# Patient Record
Sex: Female | Born: 1970 | Race: White | Hispanic: No | Marital: Married
Health system: Midwestern US, Community
[De-identification: ages and names within clinical notes are randomized; demographics above are authoritative.]

## PROBLEM LIST (undated history)

## (undated) DIAGNOSIS — Z1231 Encounter for screening mammogram for malignant neoplasm of breast: Secondary | ICD-10-CM

---

## 2013-11-26 ENCOUNTER — Encounter

## 2013-12-17 NOTE — Progress Notes (Signed)
DATE OF VISIT: 12/17/2013      REFERRING PHYSICIAN: Leatha Gilding, MD  [address] [facility]  [place], [place] [address]    REASON FOR REFERRAL: varicose veins          Natalie Logan is a 43 y.o. female sent in consultation for varicose veins with symptoms L>R.  She has seen occurrence of the veins since her first pregnancy.  Symptoms are intermittent and include aching pain. She does not need to elevate her legs to relieve pain, she has not worn compression hose, she denies difficulties walking or standing.  She does not take pain relievers for pain. She has never had bleeding from her veins.      She has never had a formal evaluation of her varicose veins, never had vein injections or surgery.  Denies cellulitis, phlebitis, DVT or PE.  She is not interested in simple sclerotherapy at this time but rather interested in a complete work up.     Chief Complaint   Patient presents with   ??? New Patient     VV             MEDICAL HISTORY:   Past Medical History   Diagnosis Date   ??? H/O seasonal allergies    ??? Varicose vein    ??? Varicose veins of lower extremities with other complications 12/17/2013         SURGICAL HISTORY:   Past Surgical History   Procedure Laterality Date   ??? Hx tubal ligation     ??? Hx cholecystectomy         Review of Systems   A focused review of systems was negative except for that written in the HPI    ALLERGIES: No Known Allergies    CURRENT MEDICATIONS:  Current Outpatient Prescriptions   Medication Sig Dispense   ??? cetirizine (ZYRTEC) 10 mg tablet Take  by mouth daily.       No current facility-administered medications for this visit.         SOCIAL HISTORY:   History   Smoking status   ??? Never Smoker    Smokeless tobacco   ??? Never Used                                       History   Alcohol Use   ??? Yes         IMAGING: none      PHYSICAL EXAM  VITALS:   Filed Vitals:    12/17/13 1434   BP: 138/99   Pulse: 90   Height: 5\' 4"  (1.626 m)   Weight: 138 lb (62.596 kg)        GENERAL: Well developed, well nourished 43 y.o. in no acute distress  HEAD/NECK: normocephalic, atraumatic  LUNGS: excursions normal and symmetrical  HEART: Regular rate and rhythm  EXTREMITIES: upper without CCE.  Lower extremities: palpable distal pulses.  No clubbing, cyanosis or edema. Scattered reticular veins in the right medial and central thigh.  Scattered spider veins and reticular veins below the knees bilaterally with the left leg with raised varix below the knee near the calf. No hyperpigmentation or other chronic stigmata of chronic venous insufficiency.  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  She will return with a standing venous duplex of her legs and follow up with Dr Jean RosenthalJackson to review the results and provide recommendations.  I discussed with her that vein closure procedures are reserved for patient with life style limiting symptoms, I offered her sclerotherapy, but she was not interested and wished to persue a full vein work up of her veins.    Orders Placed This Encounter   ??? DUPLEX VENOUS REFLUX LEFT           Joline MaxcyKIM L Badr Piedra, NP

## 2015-09-14 ENCOUNTER — Encounter: Attending: Obstetrics & Gynecology

## 2015-11-04 ENCOUNTER — Encounter: Attending: Obstetrics & Gynecology

## 2016-01-12 ENCOUNTER — Ambulatory Visit: Admit: 2016-01-12 | Discharge: 2016-01-12 | Payer: BLUE CROSS/BLUE SHIELD | Attending: Obstetrics & Gynecology

## 2016-01-12 DIAGNOSIS — Z01419 Encounter for gynecological examination (general) (routine) without abnormal findings: Secondary | ICD-10-CM

## 2016-01-12 LAB — AMB POC URINALYSIS DIP STICK AUTO W/O MICRO
Bilirubin (UA POC): NEGATIVE
Glucose (UA POC): NEGATIVE
Ketones (UA POC): NEGATIVE
Leukocyte esterase (UA POC): NEGATIVE
Nitrites (UA POC): NEGATIVE
Specific gravity (UA POC): 1 — AB (ref 1.001–1.035)
Urobilinogen (UA POC): NORMAL (ref 0.2–1)
pH (UA POC): 5 (ref 4.6–8.0)

## 2016-01-12 LAB — AMB POC HEMOGLOBIN (HGB): Hemoglobin (POC): 10.7

## 2016-01-12 NOTE — Progress Notes (Signed)
Name: Natalie Logan  Chief Complaint   Patient presents with   ??? Well Woman     Pt states she had 2 periods in Dec. but not since then.     Age: 45 y.o.  LMP: Patient's last menstrual period was 01/12/2016.  OB History     Gravida Para Term Preterm AB TAB SAB Ectopic Multiple Living    3 2                Last Pap:  normal 2015  Last HPV:   n/a  High Risk: N/A risk factor n/a  Last Mammo:  n/a  Last Bone Density:  n/a  Health Maintenance Due   Topic Date Due   ??? Flu Vaccine  06/13/2015     History   Sexual Activity   ??? Sexual activity: Yes   ??? Partners: Male   ??? Birth control/ protection: Surgical       HPI  No complaints    Family History   Problem Relation Age of Onset   ??? Diabetes Mother    ??? Hypertension Father    ??? Other Maternal Grandmother      vv   ??? Diabetes Maternal Grandmother    ??? Heart Disease Maternal Grandmother    ??? Stroke Maternal Grandfather          There is no immunization history on file for this patient.    Past Medical History:   Diagnosis Date   ??? H/O seasonal allergies    ??? Varicose vein    ??? Varicose veins of lower extremities with other complications 12/17/2013       Past Surgical History:   Procedure Laterality Date   ??? HX CHOLECYSTECTOMY     ??? HX TUBAL LIGATION     ??? LAP,TUBAL CAUTERY         Social History     Social History   ??? Marital status: MARRIED     Spouse name: N/A   ??? Number of children: N/A   ??? Years of education: N/A     Occupational History   ??? Not on file.     Social History Main Topics   ??? Smoking status: Never Smoker   ??? Smokeless tobacco: Never Used   ??? Alcohol use Yes      Comment: occasional   ??? Drug use: No   ??? Sexual activity: Yes     Partners: Male     Birth control/ protection: Surgical     Other Topics Concern   ??? Not on file     Social History Narrative       Review of Systems   Constitutional: Negative.    HENT: Negative.    Eyes: Negative.    Respiratory: Negative.    Cardiovascular: Negative.    Gastrointestinal: Negative.     Genitourinary: Negative.  Negative for dysuria, frequency and urgency.   Musculoskeletal: Negative.    Skin: Negative.    Neurological: Negative.    Psychiatric/Behavioral: Negative.        No Known Allergies    Visit Vitals   ??? BP 138/76 (BP 1 Location: Left arm, BP Patient Position: Sitting)   ??? Ht  (1.626 m)   ??? Wt 139 lb (63 kg)   ??? LMP 01/12/2016   ??? BMI 23.86 kg/m2       Results for orders placed or performed in visit on 01/12/16   AMB POC URINALYSIS DIP STICK AUTO W/O MICRO  Result Value Ref Range    Color (UA POC) Red     Clarity (UA POC) Cloudy     Glucose (UA POC) Negative Negative    Bilirubin (UA POC) Negative Negative    Ketones (UA POC) Negative Negative    Specific gravity (UA POC) 1.000 (A) 1.001 - 1.035    Blood (UA POC) 4+ Negative    pH (UA POC) 5.0 4.6 - 8.0    Protein (UA POC) 3+ Negative mg/dL    Urobilinogen (UA POC) normal 0.2 - 1    Nitrites (UA POC) Negative Negative    Leukocyte esterase (UA POC) Negative Negative   AMB POC HEMOGLOBIN (HGB)   Result Value Ref Range    Hemoglobin (POC) 10.7        Physical Exam   Constitutional: She is well-developed, well-nourished, and in no distress.   HENT:   Head: Normocephalic and atraumatic.   Neck: No thyromegaly present.   Cardiovascular: Normal rate and regular rhythm.    Pulmonary/Chest: Effort normal and breath sounds normal. Right breast exhibits no mass, no nipple discharge and no skin change. Left breast exhibits no mass, no nipple discharge and no skin change.   Abdominal: Soft. She exhibits no mass.   Genitourinary: Vagina normal, uterus normal, cervix normal, right adnexa normal, left adnexa normal and vulva normal. No vaginal discharge found.   Neurological: She is alert.   Skin: No rash noted.   Psychiatric: Mood and affect normal.         Orders Placed This Encounter   ??? Urinalysis, Auto w/o Micro (81003)   ??? Hemoglobin (16109)   ??? IGP, Aptima HPV, Reflex 16/18, 45 ([id number])         Assessement and Plan     Dezerae was seen today for well woman.    Diagnoses and all orders for this visit:    Well woman exam    Screening for human papillomavirus  -     IGP, Aptima HPV, Reflex 16/18, 45 ([id number])    Encounter for special screening examination for genitourinary disorder  -     Urinalysis, Auto w/o Micro (81003)    Screening for iron deficiency anemia  -     Hemoglobin (47829)        Follow-up Disposition:  Return in about 1 year (around 01/11/2017) for well woman.      Deltha Bernales Efrain Sella, MD

## 2016-01-15 LAB — PAP IG, APTIMA HPV AND RFX 16/18,45 (199305)
HPV Aptima: NEGATIVE
LABCORP 019018: 0

## 2016-01-15 LAB — PAP IG, APTIMA HPV AND RFX 16/18,45 (507805)
.: 0
HPV APTIMA: NEGATIVE

## 2018-02-17 ENCOUNTER — Ambulatory Visit: Admit: 2018-02-17 | Discharge: 2018-02-17 | Payer: BLUE CROSS/BLUE SHIELD | Attending: Obstetrics & Gynecology

## 2018-02-17 DIAGNOSIS — Z01419 Encounter for gynecological examination (general) (routine) without abnormal findings: Secondary | ICD-10-CM

## 2018-02-17 LAB — AMB POC URINALYSIS DIP STICK AUTO W/O MICRO
Bilirubin (UA POC): NEGATIVE
Glucose (UA POC): NEGATIVE
Ketones (UA POC): NEGATIVE
Leukocyte esterase (UA POC): NEGATIVE
Nitrites (UA POC): NEGATIVE
Protein (UA POC): NEGATIVE
Specific gravity (UA POC): 1.005 (ref 1.001–1.035)
Urobilinogen (UA POC): NORMAL (ref 0.2–1)
pH (UA POC): 5 (ref 4.6–8.0)

## 2018-02-17 LAB — AMB POC HEMOGLOBIN (HGB): Hemoglobin (POC): 12.5

## 2018-02-17 NOTE — Progress Notes (Signed)
HPI:  Ms. Natalie Logan is a 47 y.o.   OB History     Gravida   3    Para   2    Term        Preterm        AB        Living           SAB        TAB        Ectopic        Molar        Multiple        Live Births                 who is here today for a well woman exam pt of Natalie Logan She complains of na. Would like yearly lab work drawn.   Not fasting- wants fasting bloodwork  Never had mammogram encouraged to get       Date Performed Result   PAP 03/17 wnl HPV neg   Mammogram na    Colonoscopy na    Dexa na          G3 P0000        GYN History           Patient's last menstrual period was 02/14/2018. Cycle Length 26 Lasting 3  negative dysmenorrhea; negative postcoital bleeding    Past Medical History:  Past Medical History:   Diagnosis Date   ??? H/O seasonal allergies    ??? Varicose vein    ??? Varicose veins of lower extremities with other complications 12/17/2013       Past Surgical History:  Past Surgical History:   Procedure Laterality Date   ??? HX CHOLECYSTECTOMY     ??? HX TUBAL LIGATION     ??? LAP,TUBAL CAUTERY         Allergies:   No Known Allergies    Medication History:  Current Outpatient Medications   Medication Sig Dispense Refill   ??? cetirizine (ZYRTEC) 10 mg tablet Take  by mouth daily.         Social History:  Social History     Socioeconomic History   ??? Marital status: MARRIED     Spouse name: Not on file   ??? Number of children: Not on file   ??? Years of education: Not on file   ??? Highest education level: Not on file   Occupational History   ??? Not on file   Social Needs   ??? Financial resource strain: Not on file   ??? Food insecurity:     Worry: Not on file     Inability: Not on file   ??? Transportation needs:     Medical: Not on file     Non-medical: Not on file   Tobacco Use   ??? Smoking status: Never Smoker   ??? Smokeless tobacco: Never Used   Substance and Sexual Activity   ??? Alcohol use: Yes     Comment: occasional   ??? Drug use: No   ??? Sexual activity: Yes     Partners: Male     Birth control/protection: Surgical      Comment: tubal   Lifestyle   ??? Physical activity:     Days per week: Not on file     Minutes per session: Not on file   ??? Stress: Not on file   Relationships   ??? Social connections:     Talks on phone: Not  on file     Gets together: Not on file     Attends religious service: Not on file     Active member of club or organization: Not on file     Attends meetings of clubs or organizations: Not on file     Relationship status: Not on file   ??? Intimate partner violence:     Fear of current or ex partner: Not on file     Emotionally abused: Not on file     Physically abused: Not on file     Forced sexual activity: Not on file   Other Topics Concern   ??? Not on file   Social History Narrative   ??? Not on file       Family History:  Family History   Problem Relation Age of Onset   ??? Diabetes Mother    ??? Hypertension Father    ??? Other Maternal Grandmother         vv   ??? Diabetes Maternal Grandmother    ??? Heart Disease Maternal Grandmother    ??? Stroke Maternal Grandfather        Review of Systems - General ROS: negative except for that discussed in HPI      ROS:  Feeling well. No dyspnea or chest pain on exertion.  No abdominal pain, change in bowel habits, black or bloody stools.  No urinary tract symptoms. No neurological complaints.    Objective:     Visit Vitals  BP 133/76   Wt 124 lb (56.2 kg)   LMP 02/14/2018   BMI 21.28 kg/m??     Urine:  Results for orders placed or performed in visit on 02/17/18   AMB POC URINALYSIS DIP STICK AUTO W/O MICRO     Status: None   Result Value Ref Range Status    Color (UA POC) Light Yellow  Final    Clarity (UA POC) Clear  Final    Glucose (UA POC) Negative Negative Final    Bilirubin (UA POC) Negative Negative Final    Ketones (UA POC) Negative Negative Final    Specific gravity (UA POC) 1.005 1.001 - 1.035 Final    Blood (UA POC) 1+ Negative Final    pH (UA POC) 5.0 4.6 - 8.0 Final    Protein (UA POC) Negative Negative Final    Urobilinogen (UA POC) normal 0.2 - 1 Final     Nitrites (UA POC) Negative Negative Final    Leukocyte esterase (UA POC) Negative Negative Final       The patient appears well, alert, oriented x 3, in no distress.  ENT normal.  Neck supple. No adenopathy or thyromegaly.   Lungs:  clear, good air entry, no wheezes, rhonchi or rales.   Heart:  S1 and S2 normal, no murmurs, regular rate and rhythm.  Abdomen:  soft without tenderness, guarding, mass or organomegaly.   Extremities show no edema, normal peripheral pulses.   Neurological is normal, no focal findings.    BREAST EXAM: breasts appear normal, no suspicious masses, no skin or nipple changes or axillary nodes    PELVIC EXAM: External genitalia is within normal limits, urethra, urethra meatus and bladder are midline well supported. Vagina is well rugated, Cervix comes into full view and is within normal limits. Uterus is 8, ante to right week size, no ovarian masses palpated  Hematuria today and last visit- states both times during menses  Assessment/Plan:       ICD-10-CM ICD-9-CM  1. Well woman exam Z01.419 V72.31    2. Encounter for special screening examination for genitourinary disorder Z13.89 V81.6 AMB POC URINALYSIS DIP STICK AUTO W/O MICRO   3. Screening for iron deficiency anemia Z13.0 V78.0 AMB POC HEMOGLOBIN (HGB)     Encounter Diagnoses   Name Primary?   ??? Well woman exam Yes   ??? Encounter for special screening examination for genitourinary disorder    ??? Screening for iron deficiency anemia      Orders Placed This Encounter   ??? Hemoglobin (40981)   ??? Urinalysis, Auto w/o Micro (81003)   .    mammogram  pap smear  return annually or prn    Natalie Du, DO

## 2018-02-17 NOTE — Addendum Note (Signed)
Addended by: Lolita PatellaBOWERS, Maxum Cassarino Y on: 02/17/2018 09:42 AM     Modules accepted: Orders

## 2018-02-17 NOTE — Addendum Note (Signed)
Addended by: Lolita PatellaBOWERS, Sharion Grieves Y on: 02/17/2018 09:43 AM     Modules accepted: Orders

## 2018-02-20 LAB — PAP IG, APTIMA HPV AND RFX 16/18,45 (199305)
HPV Aptima: NEGATIVE
LABCORP 019018: 0

## 2018-02-20 LAB — PAP IG, APTIMA HPV AND RFX 16/18,45 (507805)
.: 0
HPV APTIMA: NEGATIVE

## 2019-01-29 IMAGING — CR XR CHEST 1 VIEW
1 series · 1 of 1 positions shown · non-contrast
Comparison: none

Portable chest
INDICATION: Cough

[AP]
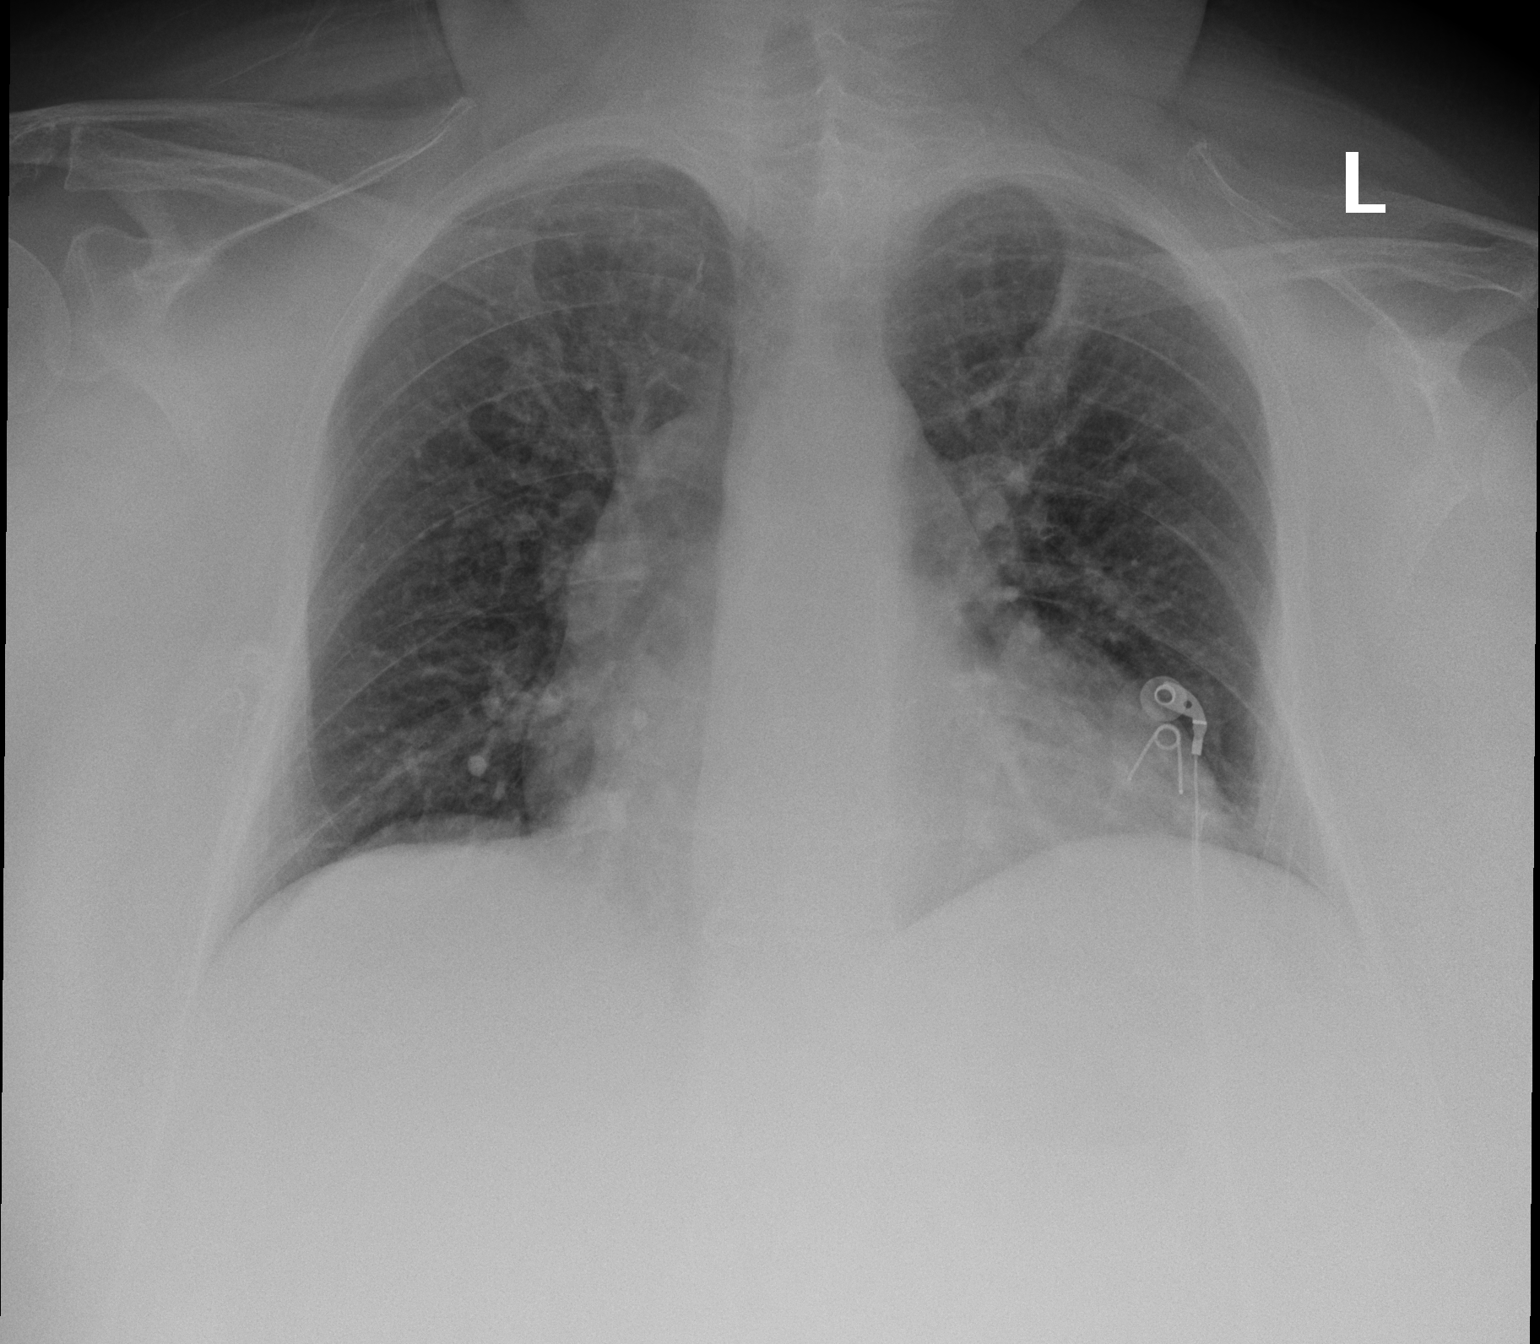

[1 of 1 positions shown; findings below may reference images not displayed]

FINDINGS: Heart size appears normal. Mild increased interstitial prominence in bilateral lungs however nonspecific and unchanged.
IMPRESSION: No focal consolidation.
Is the patient pregnant?
No

## 2019-05-26 ENCOUNTER — Encounter

## 2021-07-28 IMAGING — CR XR CHEST 1 VIEW
1 series · 1 of 1 positions shown · non-contrast
Comparison: Chest x-ray from 01/29/2019

FINAL REPORT:
INDICATION: sepsis

[AP]
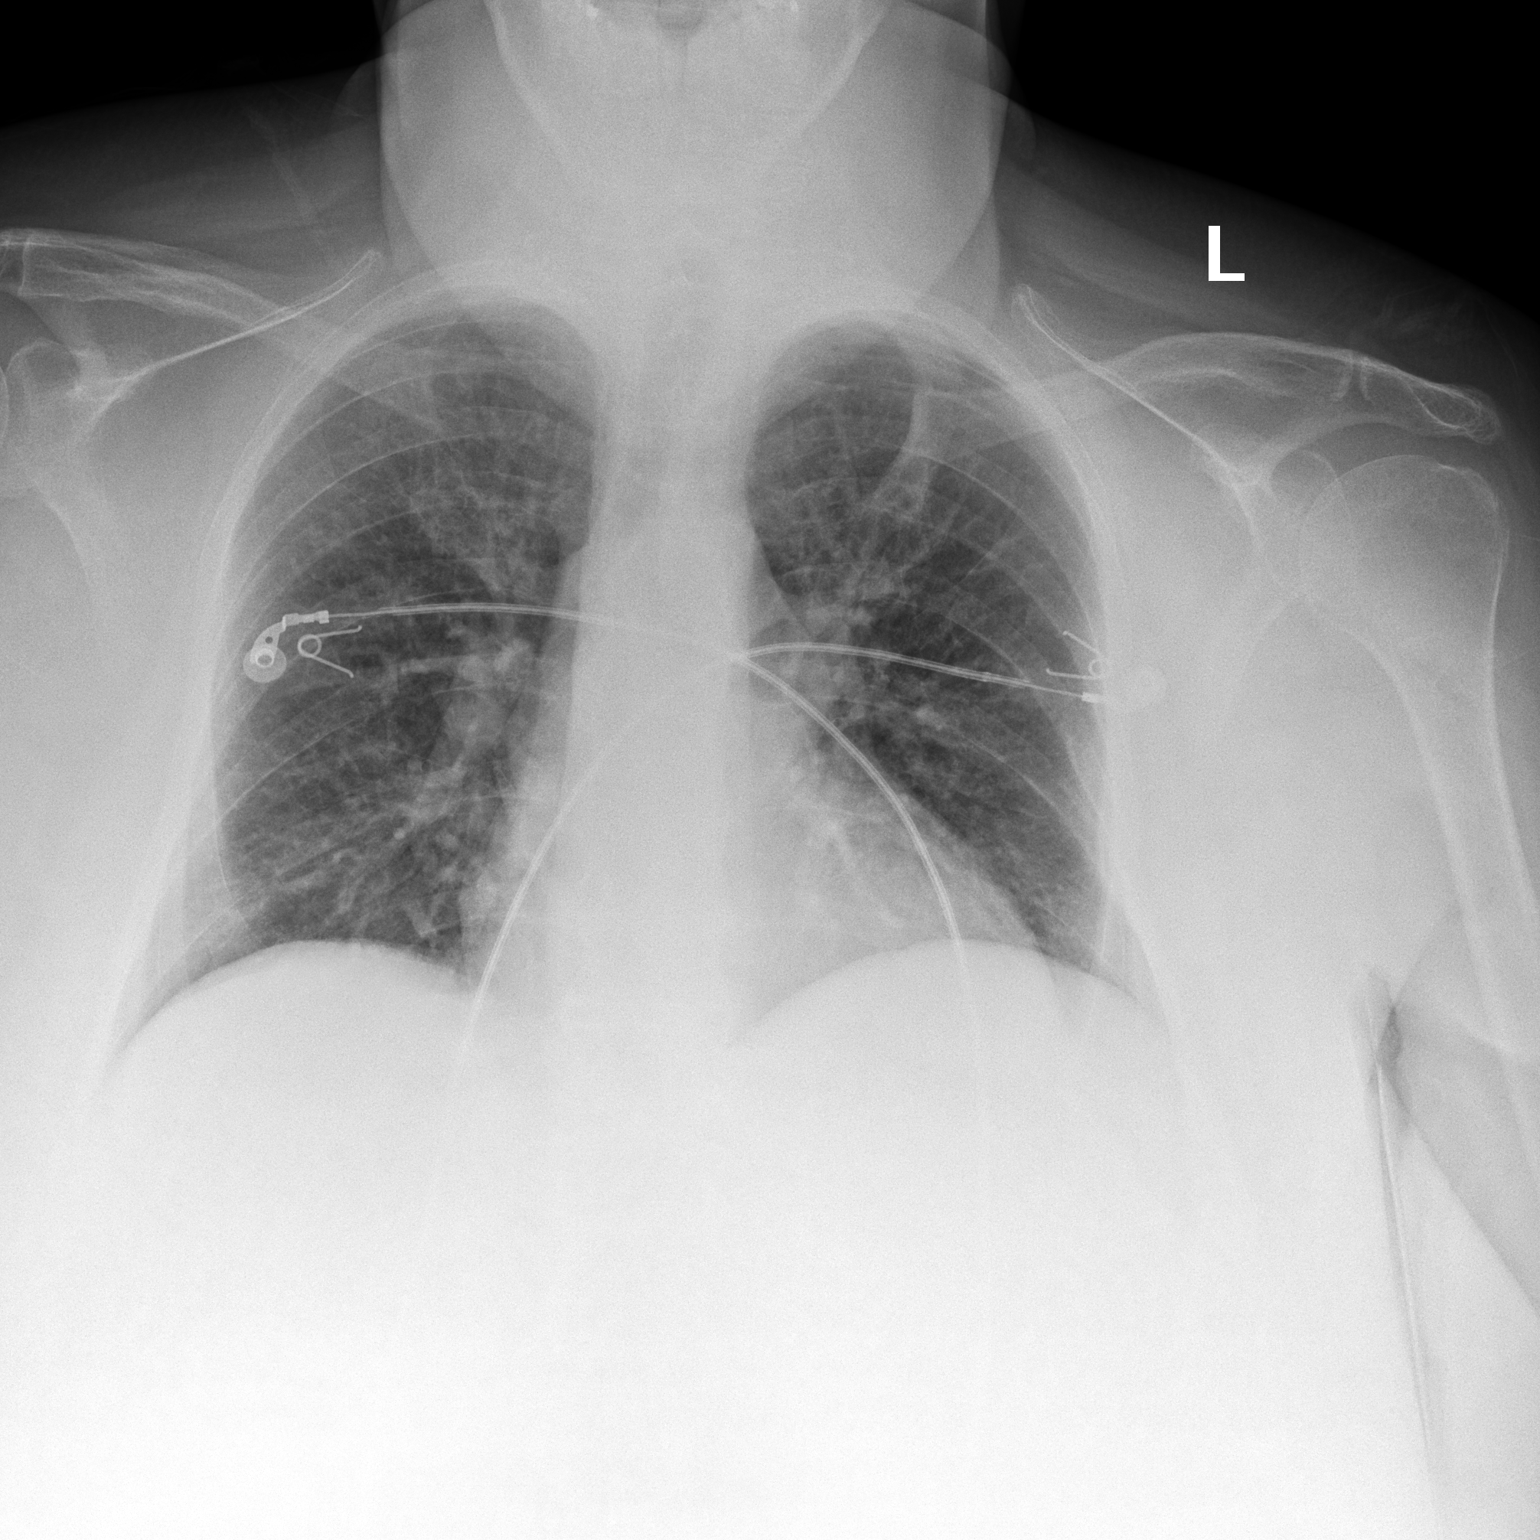

[1 of 1 positions shown; findings below may reference images not displayed]

FINDINGS: 1 view of the chest. The cardiomediastinal contours are within normal limits. No evidence of focal consolidation, pleural effusion, pulmonary edema, or pneumothorax.
IMPRESSION: 
IMPRESSION: No acute cardiopulmonary findings.
Is the patient pregnant?
No

## 2021-07-28 IMAGING — CT CT ABDOMEN PELVIS WITHOUT CONTRAST
2 of 3 series · 17 of 46 positions shown, 19 images · non-contrast
Comparison: 10/20/2014

Abdo pain
Uterine cancer
Hysterectomy
FINAL REPORT:
CT ABDOMEN PELVIS WITHOUT CONTRAST
INDICATION: Flank pain. History of uterine cancer. Hysterectomy. r/o obstructive uropathy
TECHNIQUE: CT imaging was obtained from the lung bases to the pubic symphysis without intravenous contrast. All CT scans at this facility use dose modulation and/or weight based dosing when appropriate to reduce radiation dose to as low as reasonably achievable. (#[id number]#)

[Series 2: renal stone · axial · 0.98mm/px · z∈[-384,+8]mm · 14 of 181 slices shown, 16 images]
[im 12/181  soft-tissue]
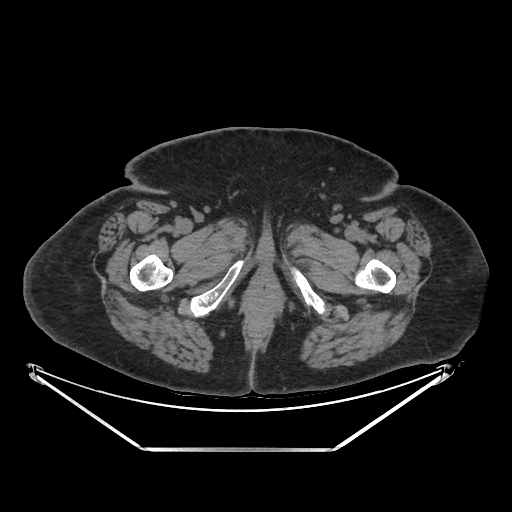
[im 12/181  bone]
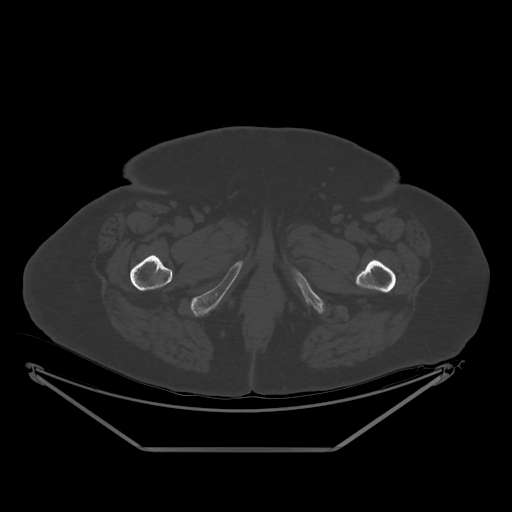
[im 24/181  soft-tissue]
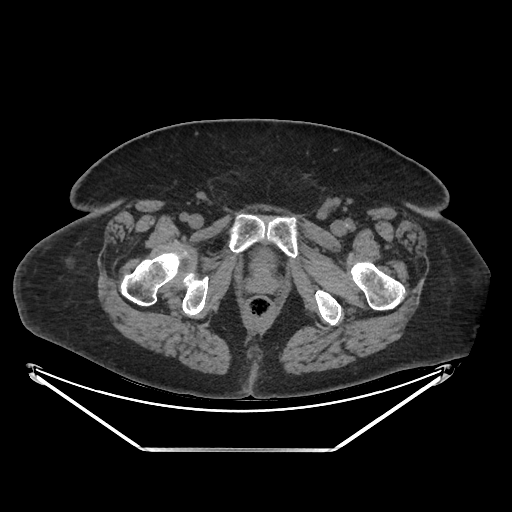
[im 35/181  soft-tissue]
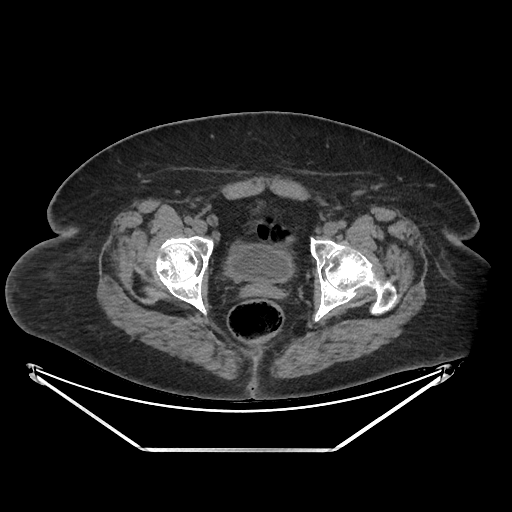
[im 47/181  soft-tissue]
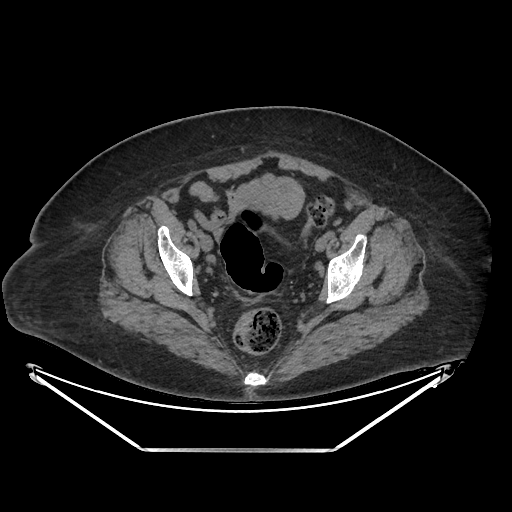
[im 59/181  soft-tissue]
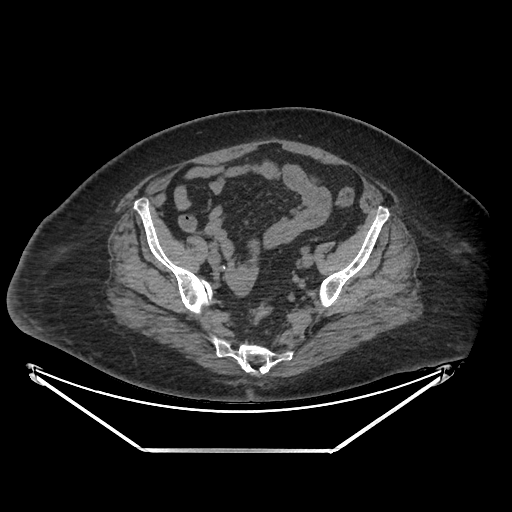
[im 70/181  soft-tissue]
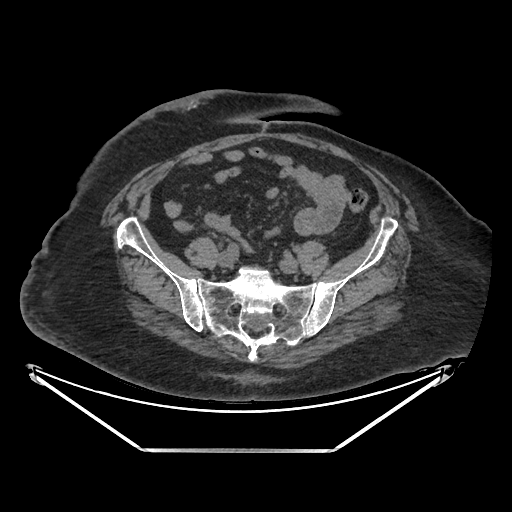
[im 82/181  soft-tissue]
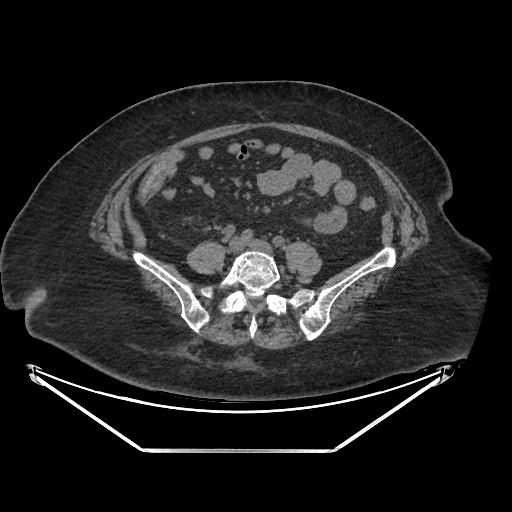
[im 99/181  soft-tissue]
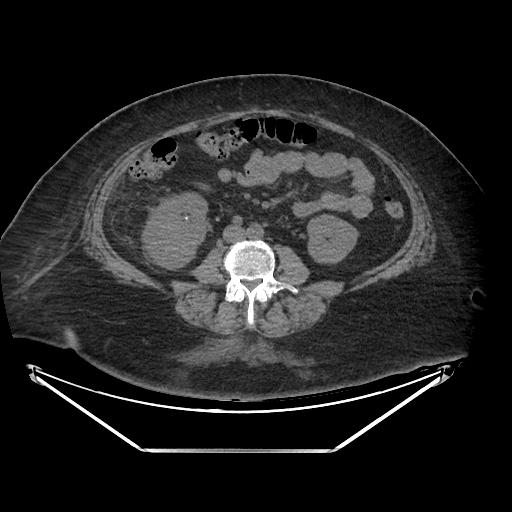
[im 111/181  soft-tissue]
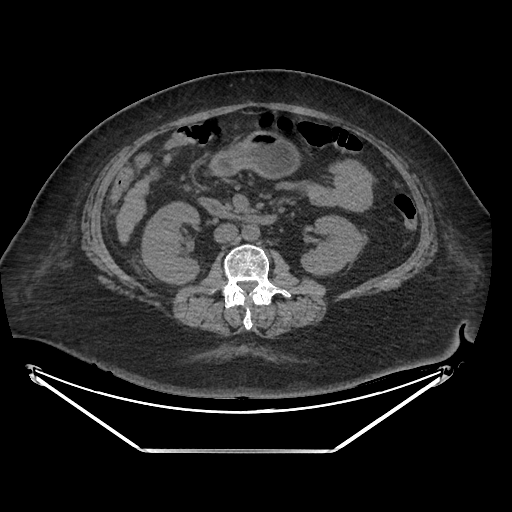
[im 111/181  bone]
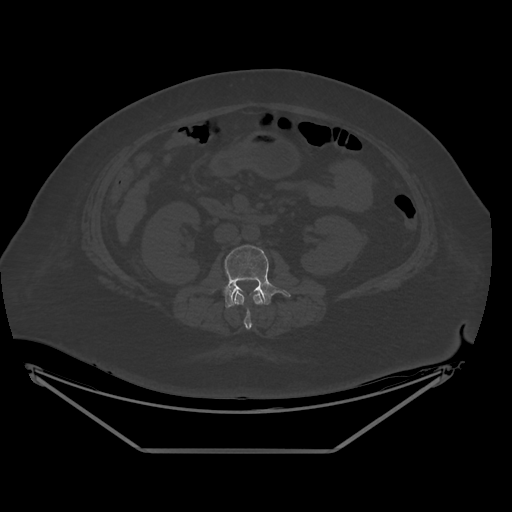
[im 122/181  soft-tissue]
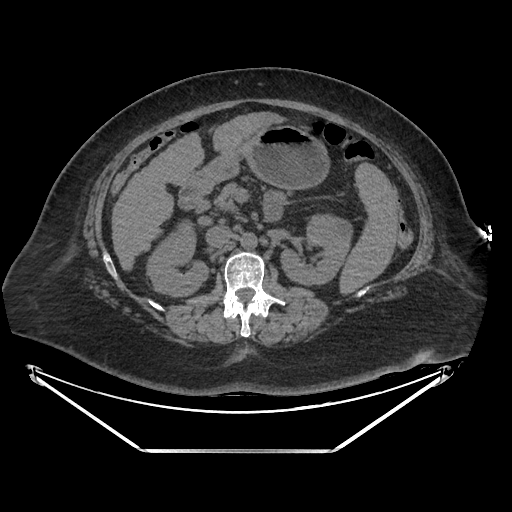
[im 134/181  soft-tissue]
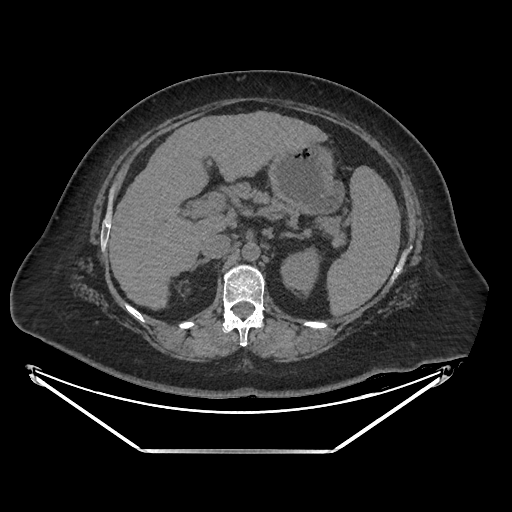
[im 146/181  soft-tissue]
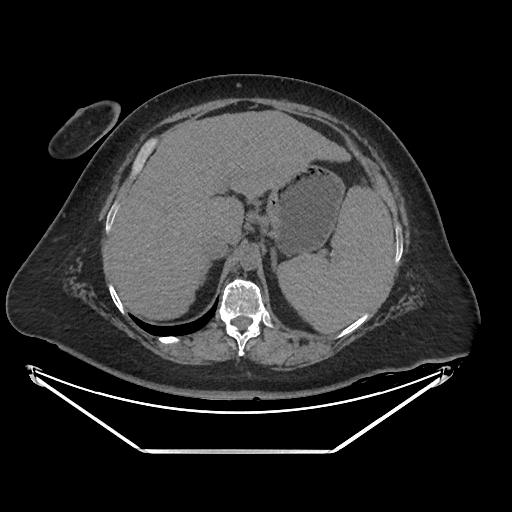
[im 157/181  soft-tissue]
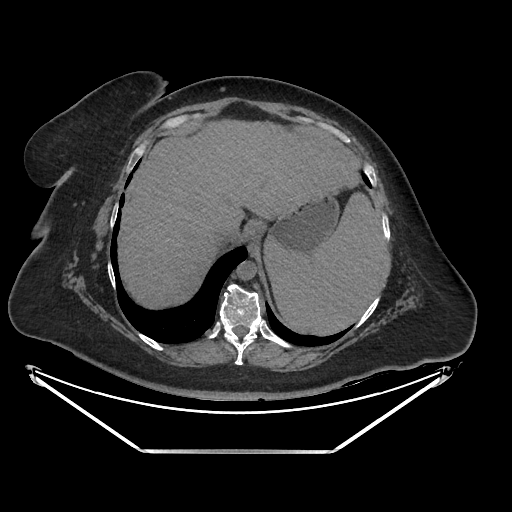
[im 169/181  soft-tissue]
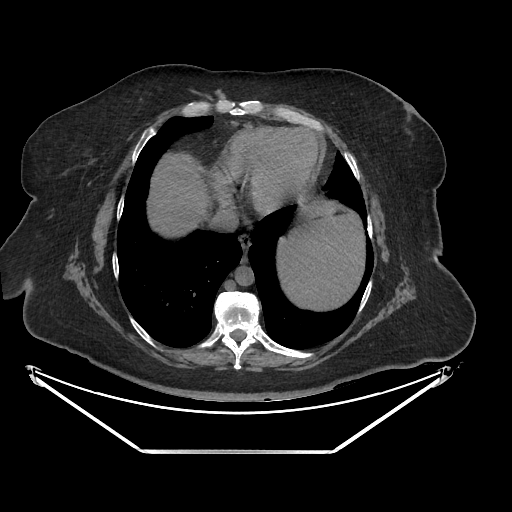

[Series 602: sag standard 2x2 · sagittal · 0.98mm/px · 3 of 241 slices shown]
[im 81/241  soft-tissue]
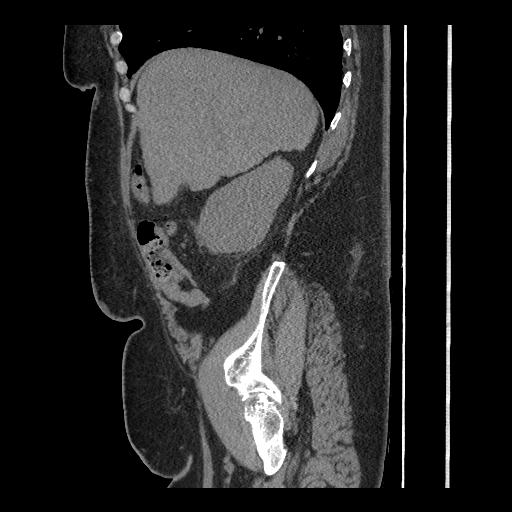
[im 107/241  soft-tissue]
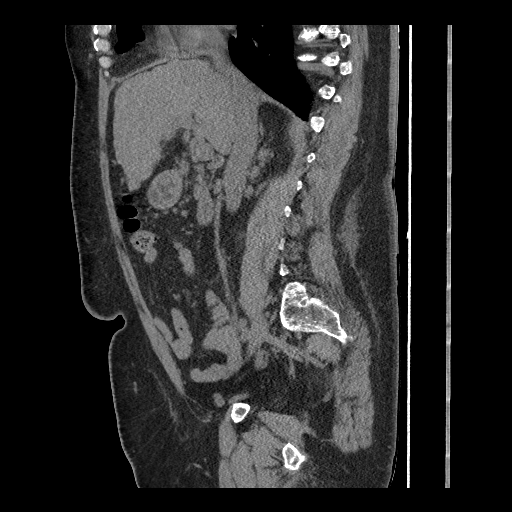
[im 134/241  soft-tissue]
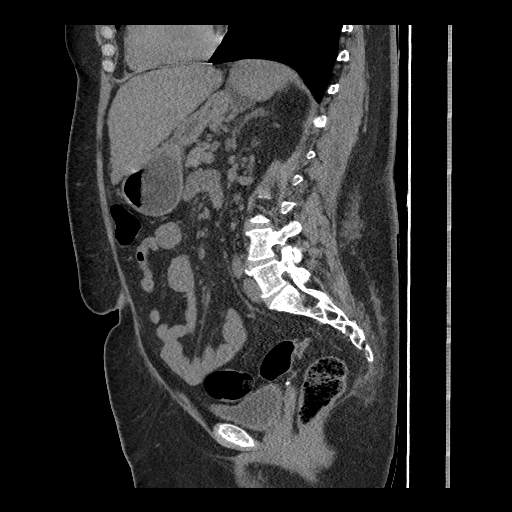

[17 of 46 positions shown; findings below may reference images not displayed]

FINDINGS: Solid organ and vascular assessment limited due to lack of intravenous contrast.
Lower thorax: The lung bases are clear. The heart appears normal.
Liver: Cirrhotic liver without appreciable mass.
Gallbladder/biliary: Cholecystectomy.
Spleen: Splenomegaly without mass.
Pancreas: Normal.
Adrenal glands:
Kidneys: Right-sided nonobstructing stones measuring up to 4 mm. Asymmetric right-sided perinephric fat stranding. No hydronephrosis. Left-sided renal cysts.
Abdominal wall: Normal.
Vascular: Minimal atherosclerotic plaque. No aneurysm.
Mesentery: Long-term stable mildly prominent lymph nodes in the porta hepatis. No new adenopathy, free air, or free fluid.
Stomach/bowel: The stomach, small bowel, appendix appear normal. Colon appears normal.
Reproductive organs: Hysterectomy.
Bladder: Mild bladder wall thickening.
Pelvic nodes: No adenopathy.
Bones: No acute osseous abnormality.
IMPRESSION: Mild bladder wall thickening with right perinephric fat stranding raising the possibility of mild pyelonephritis. This could also be related to a recently passed stone.
Nonobstructing calculi in the right kidney.
Is the patient pregnant?
No

## 2021-09-29 IMAGING — US US ABDOMEN RUQ
1 series · 14 of 25 positions shown · non-contrast
Comparison: CT of the abdomen and pelvis 07/28/2021.

FINAL REPORT:
US ABDOMEN RUQ
INDICATION: Cirrhosis. Elevation of levels of liver transaminase levels.
TECHNIQUE: Real-time sonographic evaluation of the right upper quadrant of the abdomen was performed. Grayscale and color Doppler technique were utilized as needed.

[Series 1: us abdomen ruq · 14 of 52 slices shown]
[im 1/52]
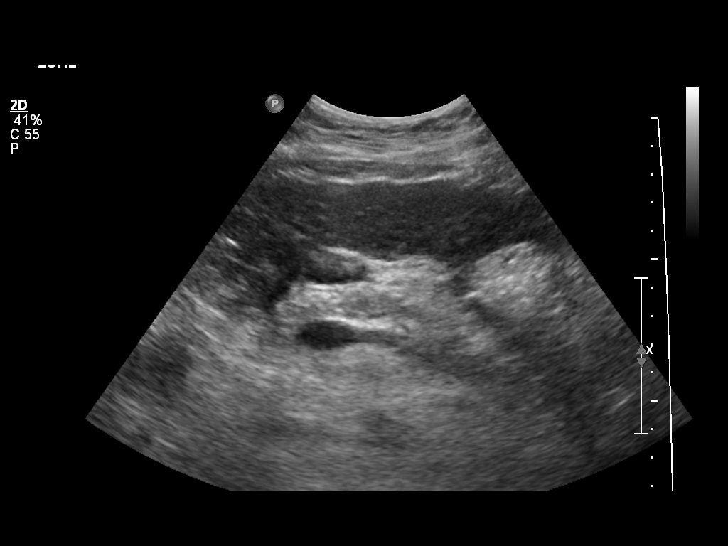
[im 5/52]
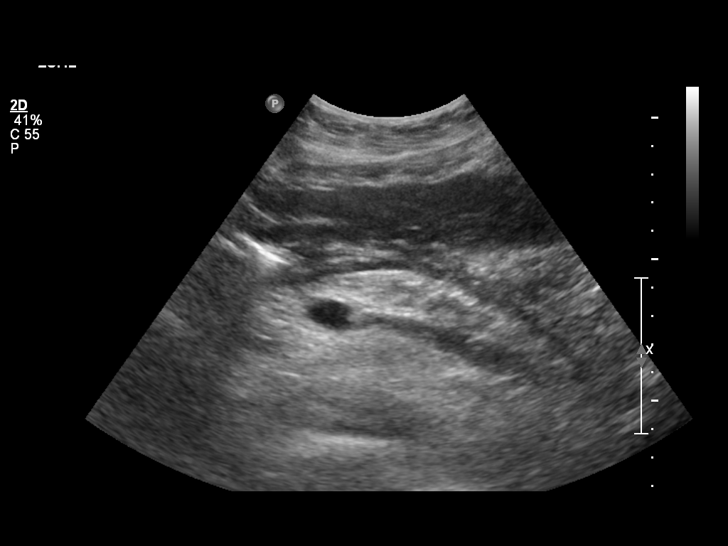
[im 9/52]
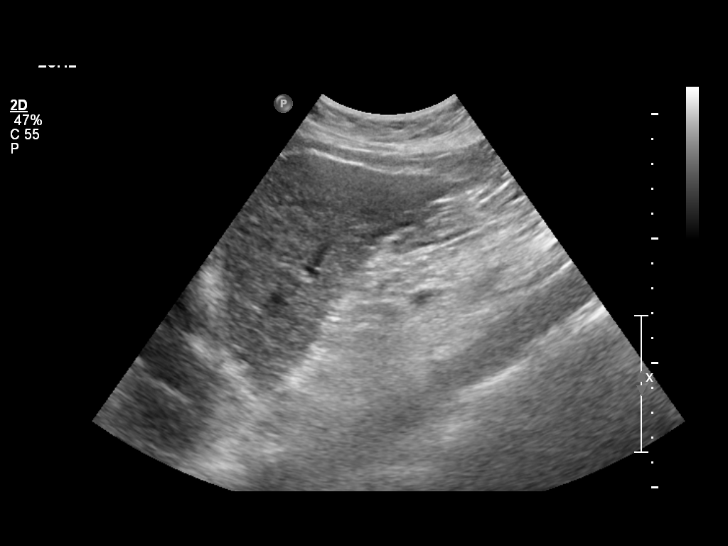
[im 13/52]
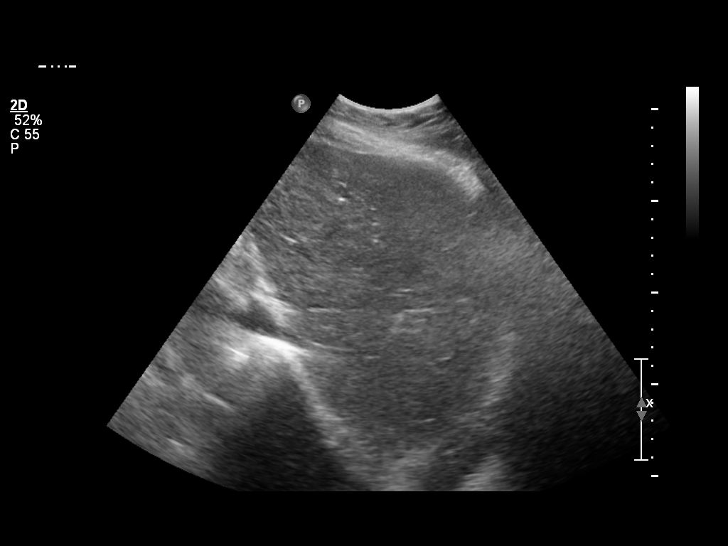
[im 18/52]
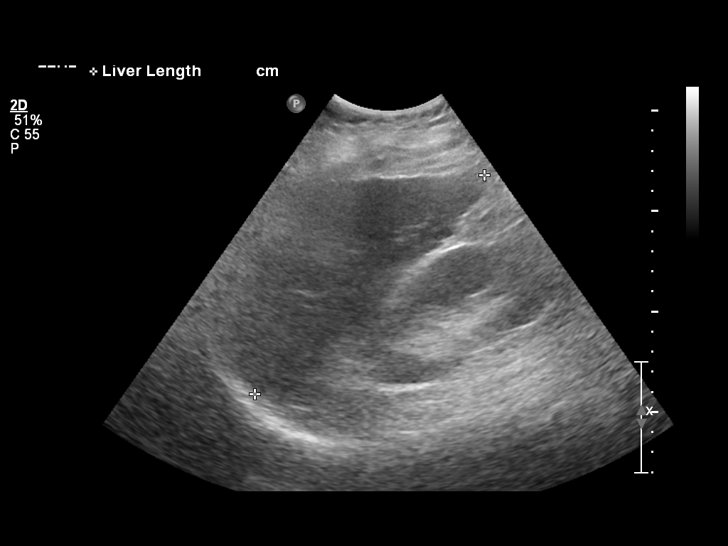
[im 20/52]
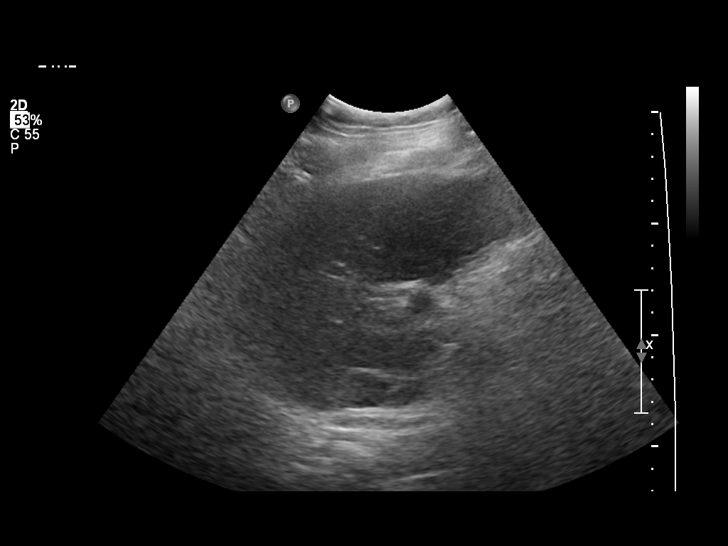
[im 24/52]
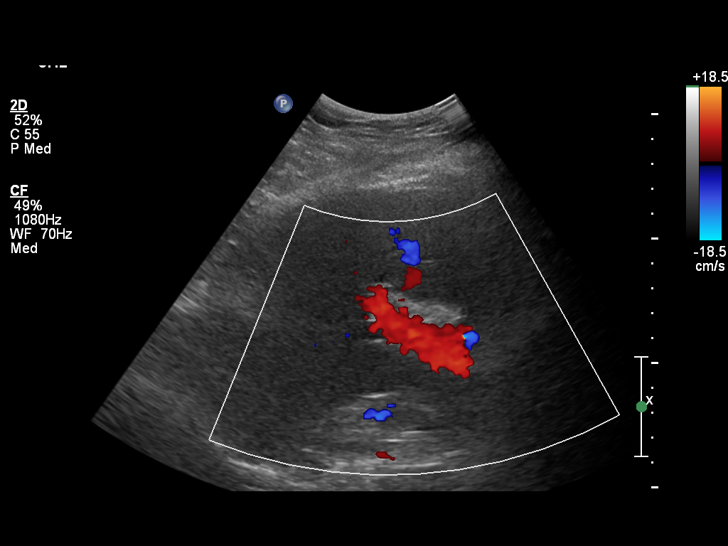
[im 28/52]
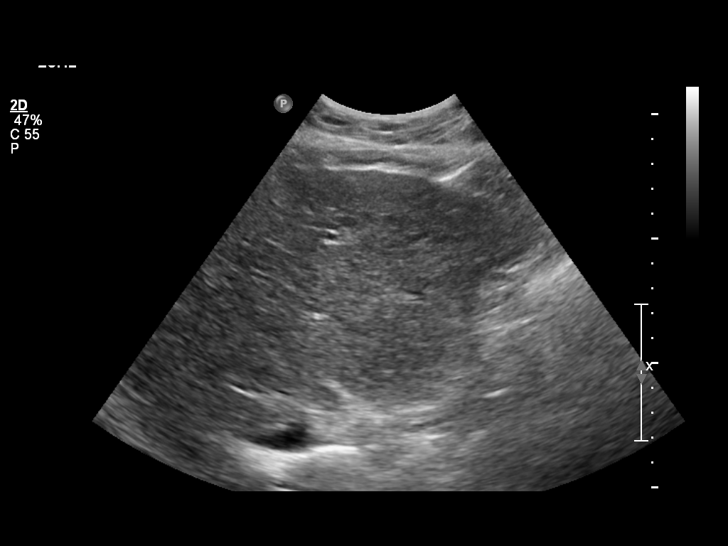
[im 32/52]
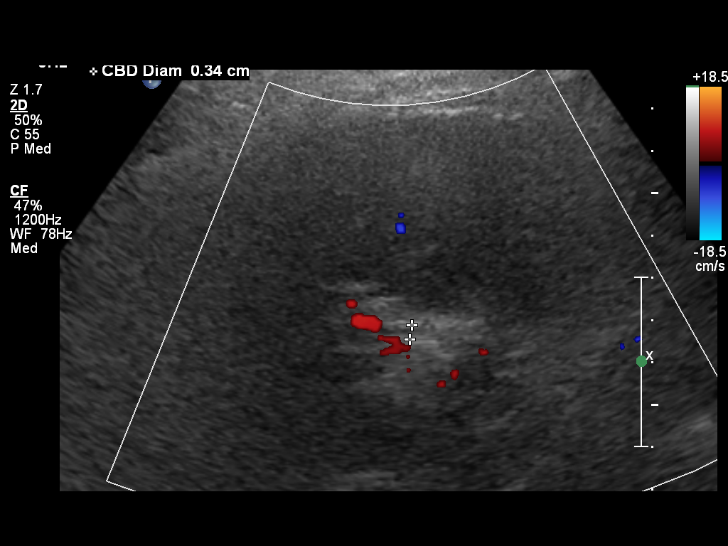
[im 35/52]
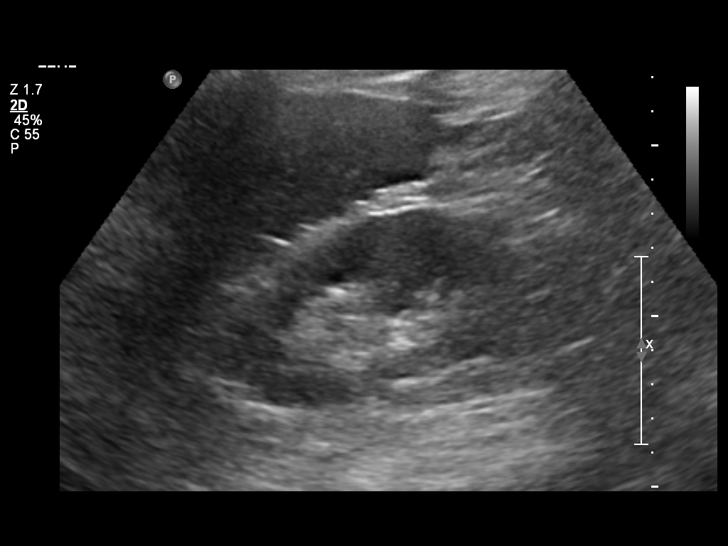
[im 39/52]
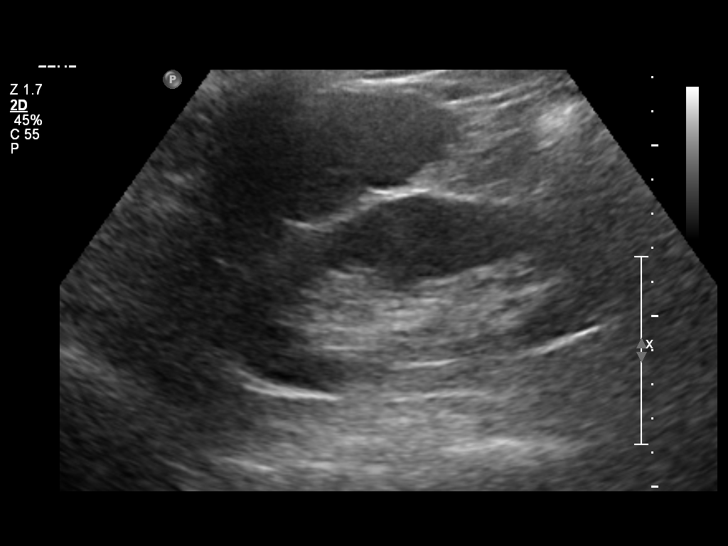
[im 43/52]
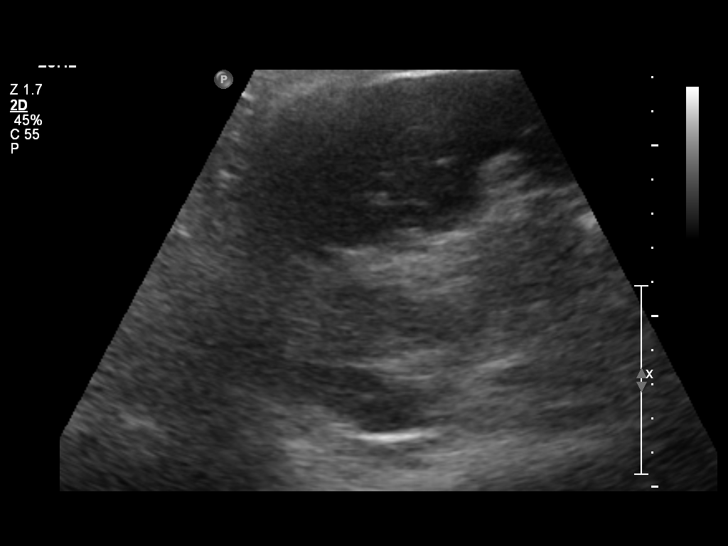
[im 47/52]
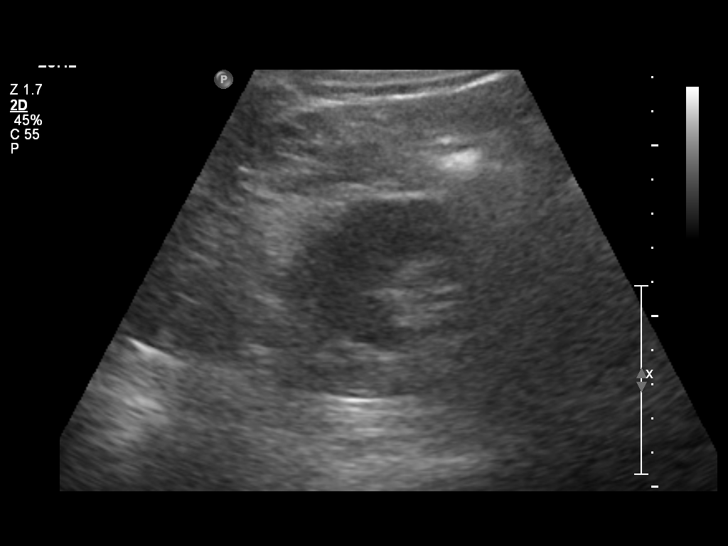
[im 52/52]
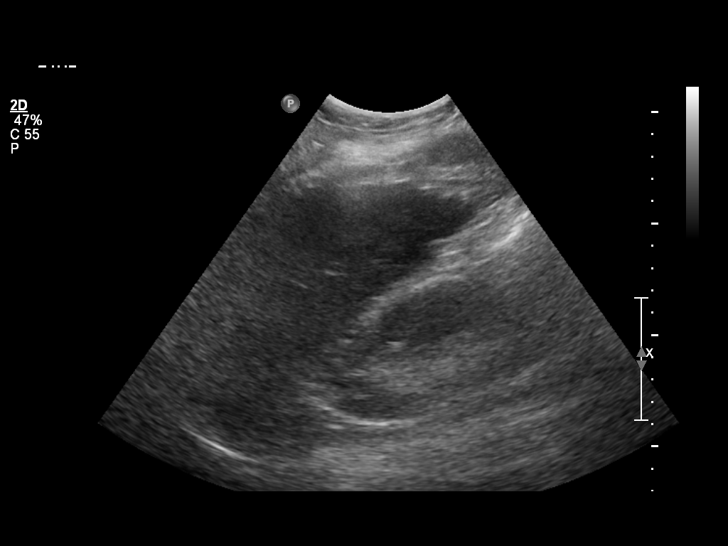

[14 of 25 positions shown; findings below may reference images not displayed]

FINDINGS: The liver measures 15.8 cm and is heterogeneous in echotexture. No obvious solid liver masses are identified on sonographic evaluation. There is no intrahepatic ductal dilatation. The common bile duct measures 3 mm, which is within normal limits.
The gallbladder is surgically absent. A sonographic Murphy's sign was assessed and reported as negative.
The visualized portions of the pancreas demonstrate no significant abnormality.
Survey view of the right kidney demonstrates no hydronephrosis. The right kidney measures 11.9 cm.
The visualized aorta are unremarkable. The main portal vein flow is hepatopedal.
The IVC is not visualized secondary to overlying bowel gas.
No ascites is present.
IMPRESSION: 1. Cirrhosis without evidence of a liver lesion.
2. No acute sonographic findings identified.

## 2023-03-29 IMAGING — MR MRI BRAIN WITHOUT CONTRAST
10 of 11 series · 39 of 48 positions shown · non-contrast
Comparison: None.

HEADACHES
FINAL REPORT:
EXAM: MRI of the brain without contrast.
HISTORY: Headache, unspecified
TECHNIQUE: Multisequence multiplanar images of the brain were obtained.

[Series 2: survey_mpr_sag · sagittal · 1.6mm · 1.60mm/px · 1 of 5 slices shown]
[im 1/5]
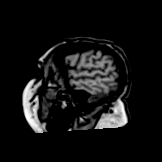

[Series 3: survey_mpr_cor · coronal · 1.6mm · 1.60mm/px · 1 of 3 slices shown]
[im 1/3]
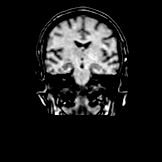

[Series 4: survey_mpr_tra · axial · 1.6mm · 1.60mm/px · 1 of 3 slices shown]
[im 1/3]
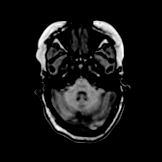

[Series 5: T1 · sagittal · 5.0mm · 0.72mm/px · 4 of 25 slices shown (1 of 2)]
[im 1/25]
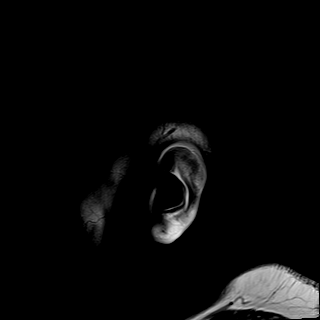
[im 9/25]
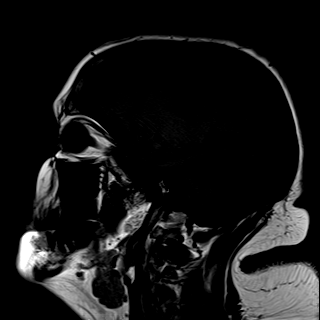
[im 17/25]
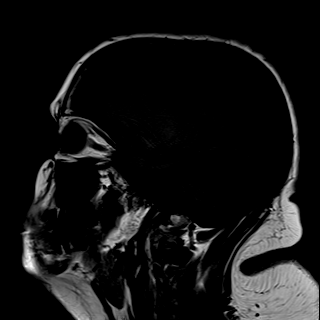
[im 25/25]
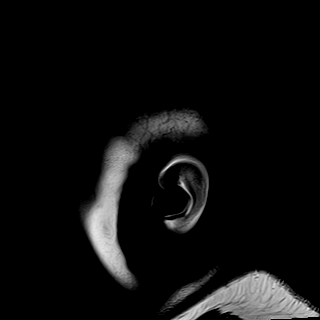

[Series 6: ax dwi_tracew · axial · 4.0mm · 0.60mm/px · z∈[-85,+62]mm · 5 of 29 slices shown]
[im 1/29]
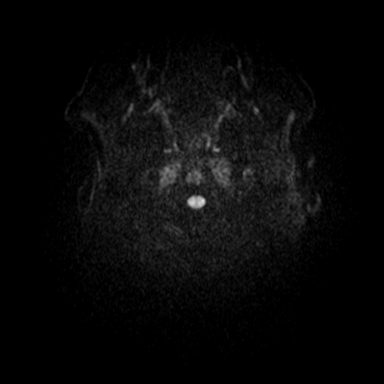
[im 8/29]
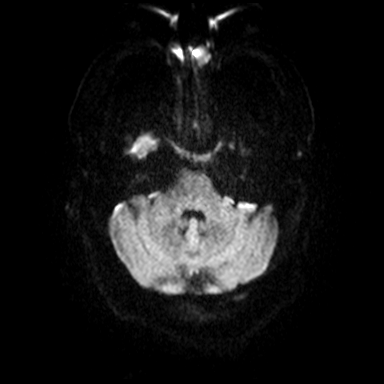
[im 15/29]
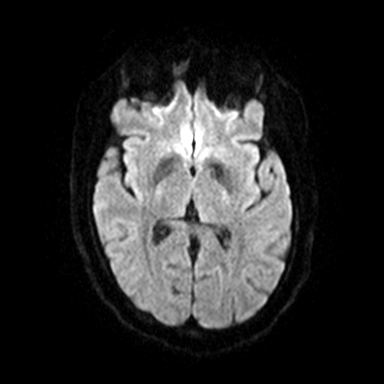
[im 22/29]
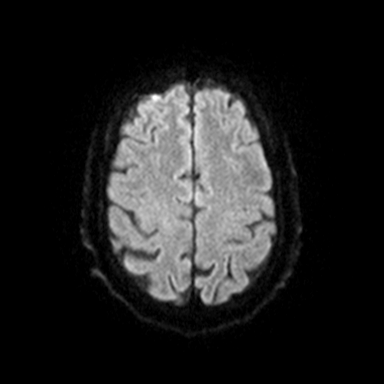
[im 29/29]
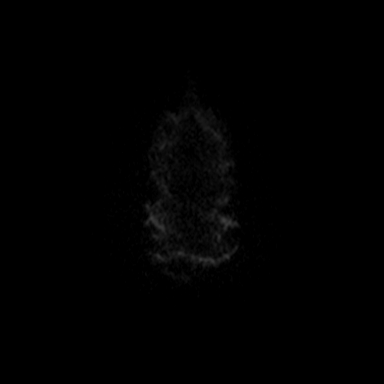

[Series 7: ax dwi_adc · axial · 4.0mm · 0.60mm/px · z∈[-85,-30]mm · 3 of 27 slices shown]
[im 1/27]
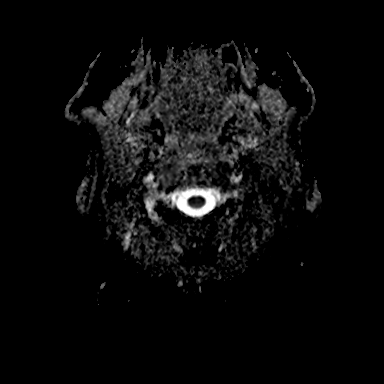
[im 6/27]
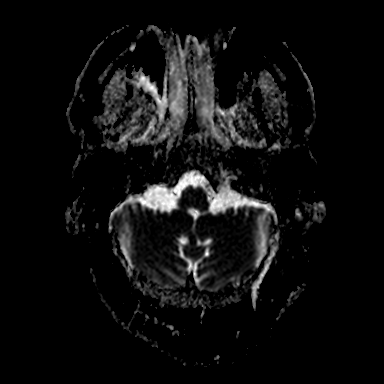
[im 11/27]
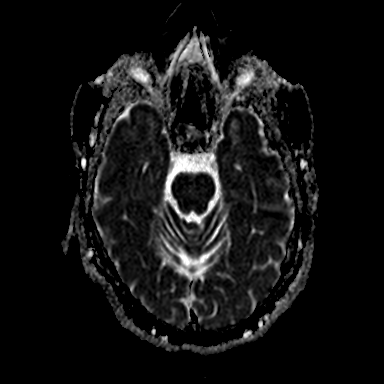

[Series 9: T2 · axial · 4.0mm · 0.51mm/px · z∈[-85,+62]mm · 6 of 28 slices shown]
[im 1/28]
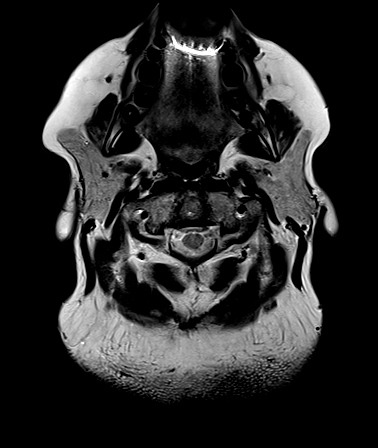
[im 6/28]
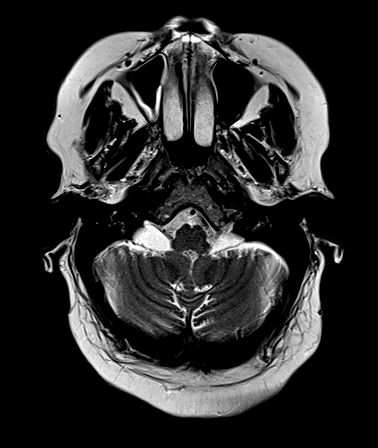
[im 11/28]
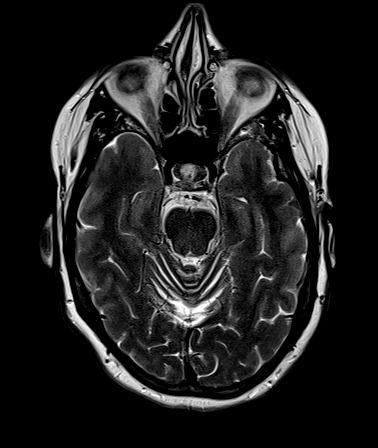
[im 17/28]
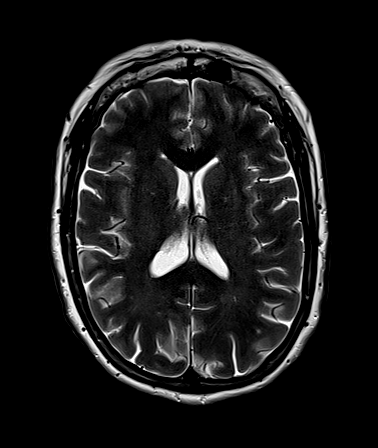
[im 22/28]
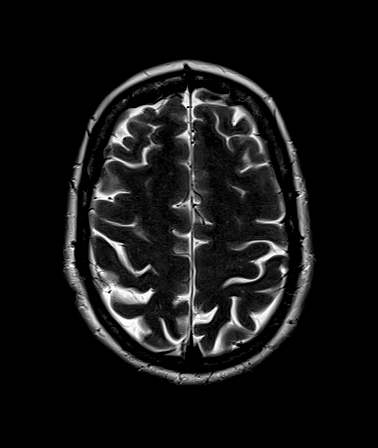
[im 28/28]
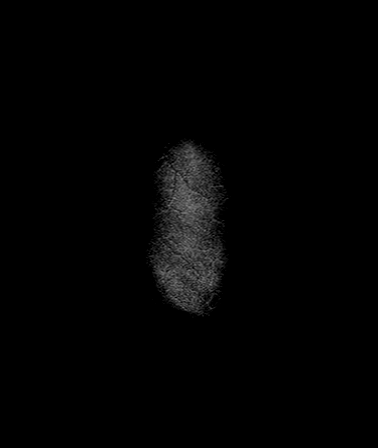

[Series 10: T1 · axial · 4.0mm · 0.72mm/px · z∈[-85,+62]mm · 6 of 28 slices shown (2 of 2)]
[im 1/28]
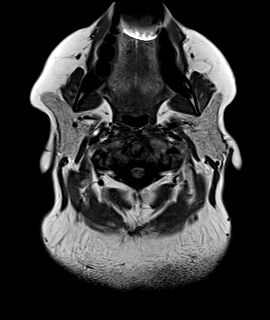
[im 6/28]
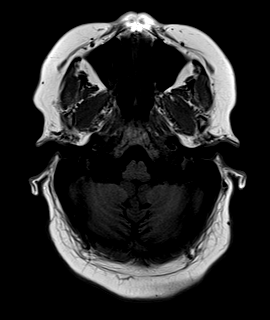
[im 11/28]
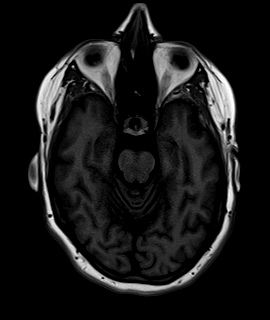
[im 17/28]
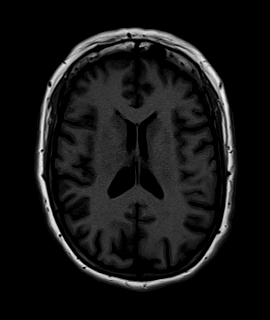
[im 22/28]
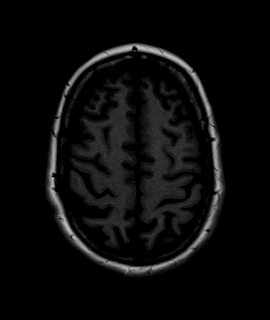
[im 28/28]
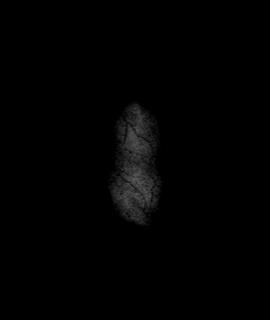

[Series 11: FLAIR · axial · 4.0mm · 0.72mm/px · z∈[-85,+62]mm · 6 of 28 slices shown]
[im 1/28]
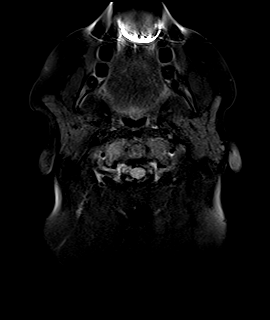
[im 6/28]
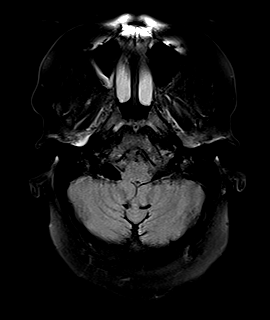
[im 11/28]
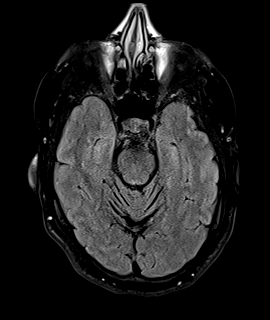
[im 17/28]
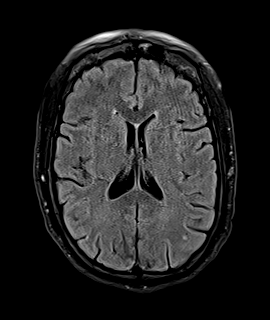
[im 22/28]
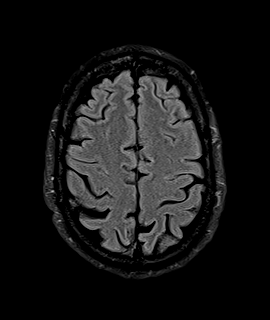
[im 28/28]
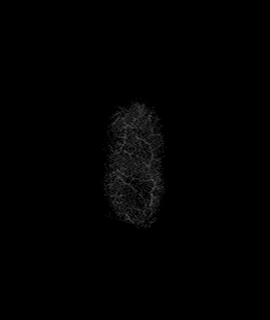

[Series 12: GRE · axial · 4.0mm · 0.45mm/px · z∈[-85,+62]mm · 6 of 28 slices shown]
[im 1/28]
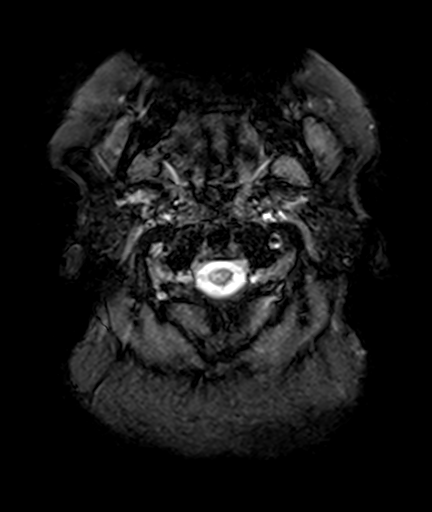
[im 6/28]
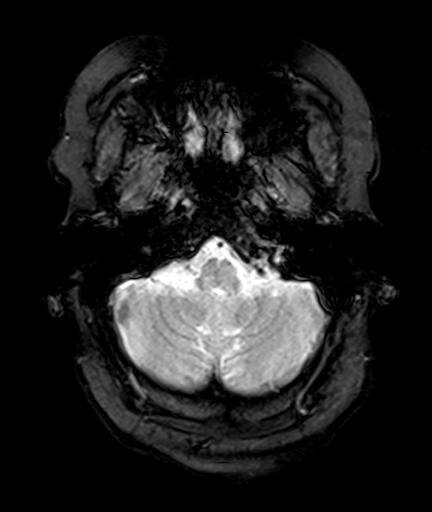
[im 11/28]
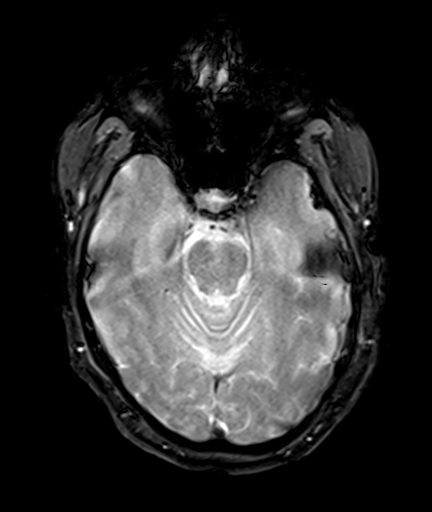
[im 17/28]
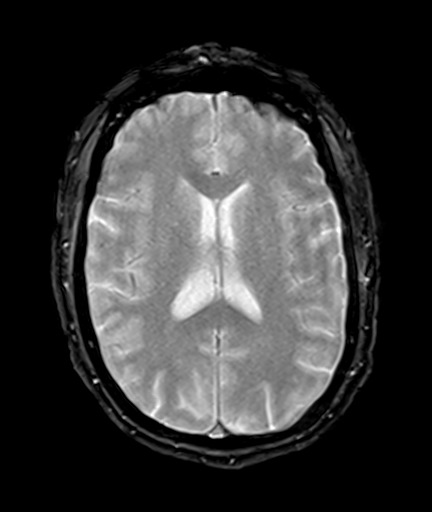
[im 22/28]
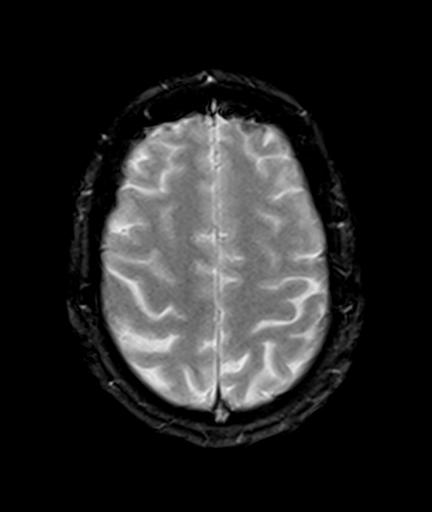
[im 28/28]
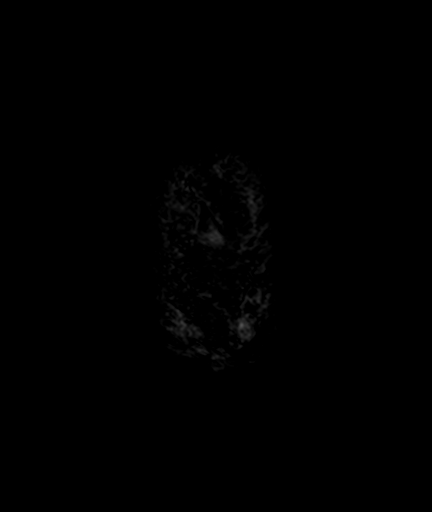

[39 of 48 positions shown; findings below may reference images not displayed]

FINDINGS: No midline shift. Basal cisterns are patent.  Ventricles are normal and symmetric for patient's age.
No evidence for acute infarct, intracranial hemorrhage or mass lesion. No extra-axial fluid collections.
Intracranial vascular flow voids are normal.
Orbital contents are unremarkable.
IAC are symmetric and unremarkable.
Minimal mucosal thickening is seen in the ethmoid air cells. Main paranasal sinuses and mastoid air cells are clear..
IMPRESSION: Normal brain MRI.
Minimal paranasal sinus disease as above.
Is the patient pregnant?
No

## 2023-08-30 IMAGING — MG MAMMO BREAST SCREENING BILATERAL
8 series · 8 of 8 positions shown · non-contrast
Comparison: [DATE], and 8011.

This is a summary report. The complete report is available in the patient's medical record. If you cannot access the medical record, please contact the sending organization for a detailed fax or copy.
FINAL REPORT:
EXAM: MAMMO BREAST SCREENING BILATERAL
CLINICAL HISTORY: Screening mammogram. No complaints. Please note, patient currently ambulates with a walker. Best obtainable images are provided given patient's physical limitations. Additionally, patient has lost 50 pounds since her 6718 mammogram.
TECHNIQUE: MLO and CC views of both breasts were obtained on a digital full-field mammographic unit. This examination was interpreted in conjunction with computer aided detection system. Nipple and skin markers were placed as needed.

[L CC (1 of 2)]
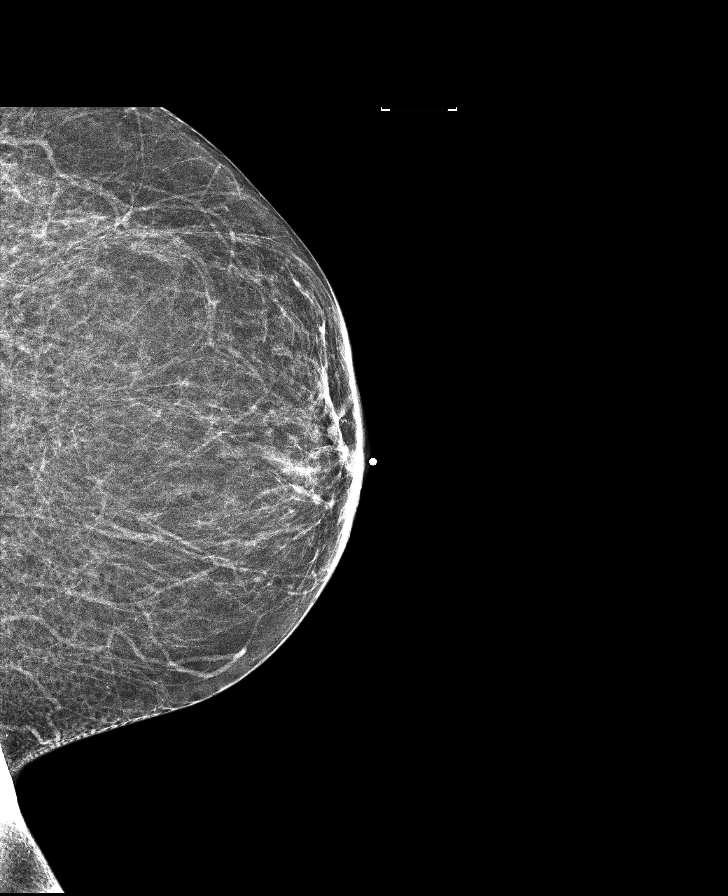

[L CC (2 of 2)]
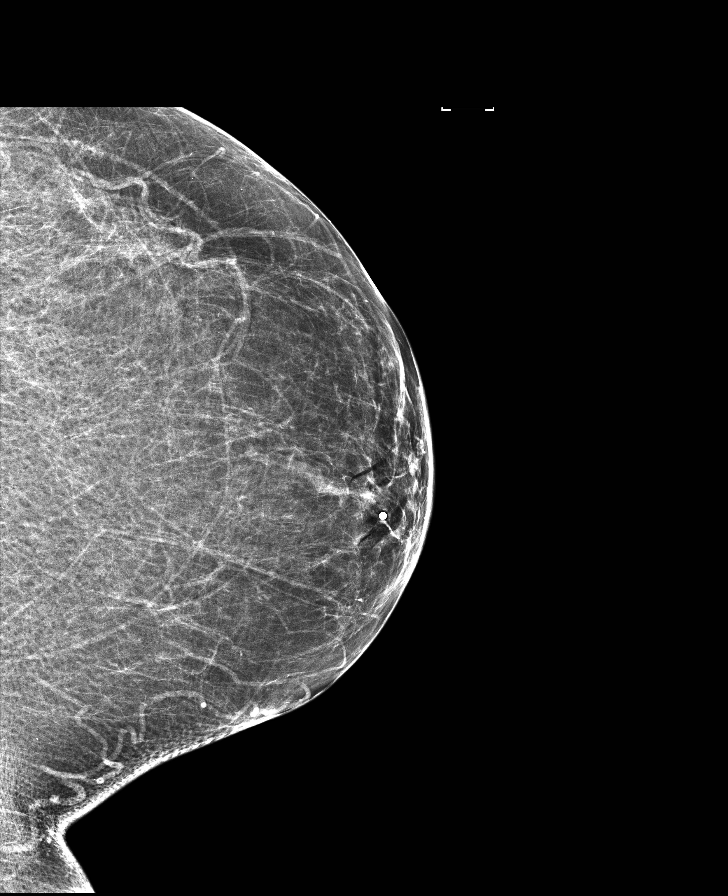

[R MLO (1 of 2)]
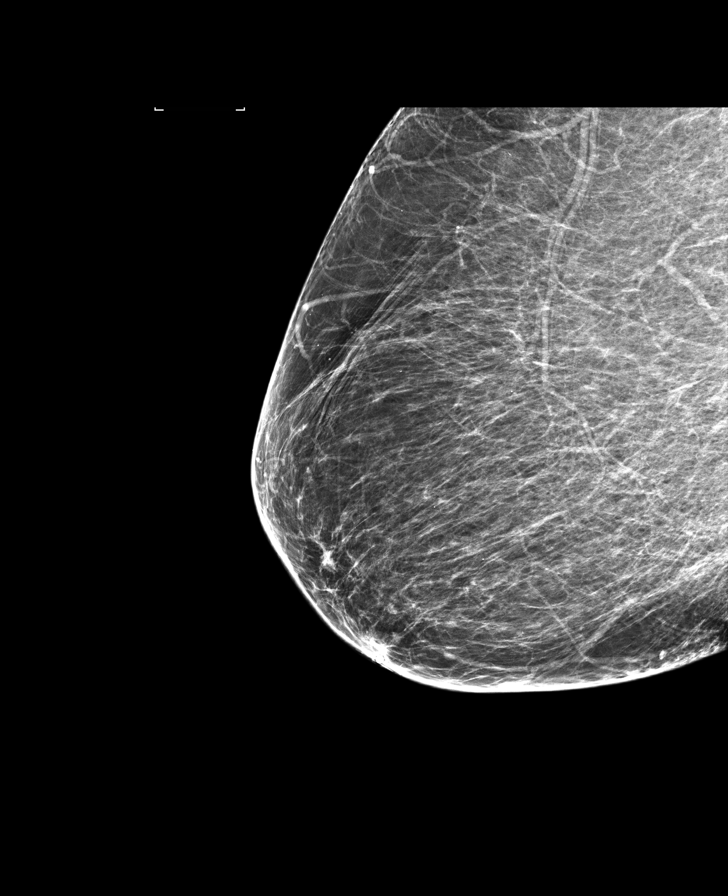

[R MLO (2 of 2)]
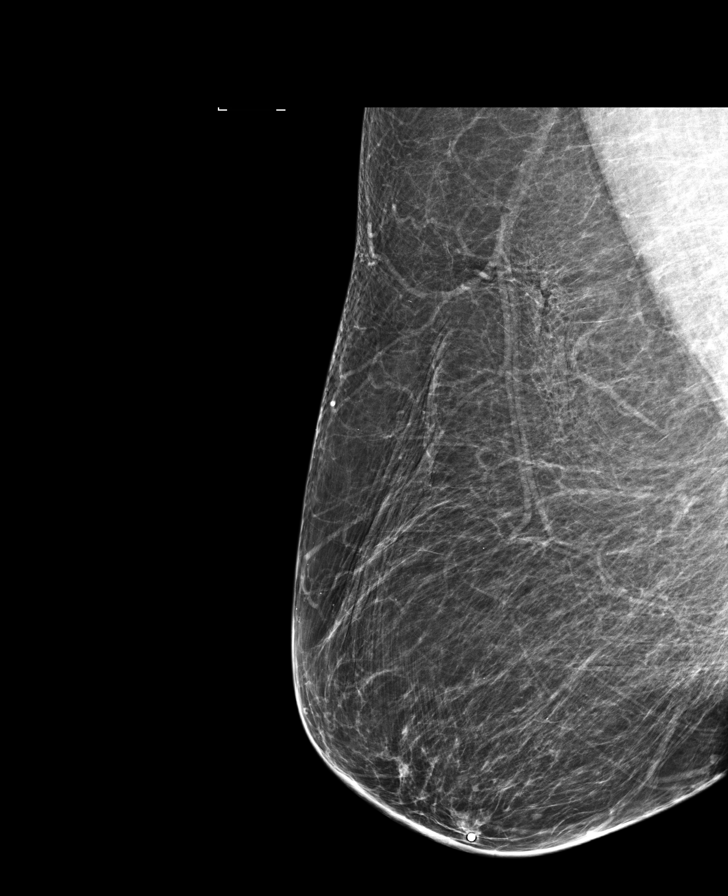

[L MLO (1 of 2)]
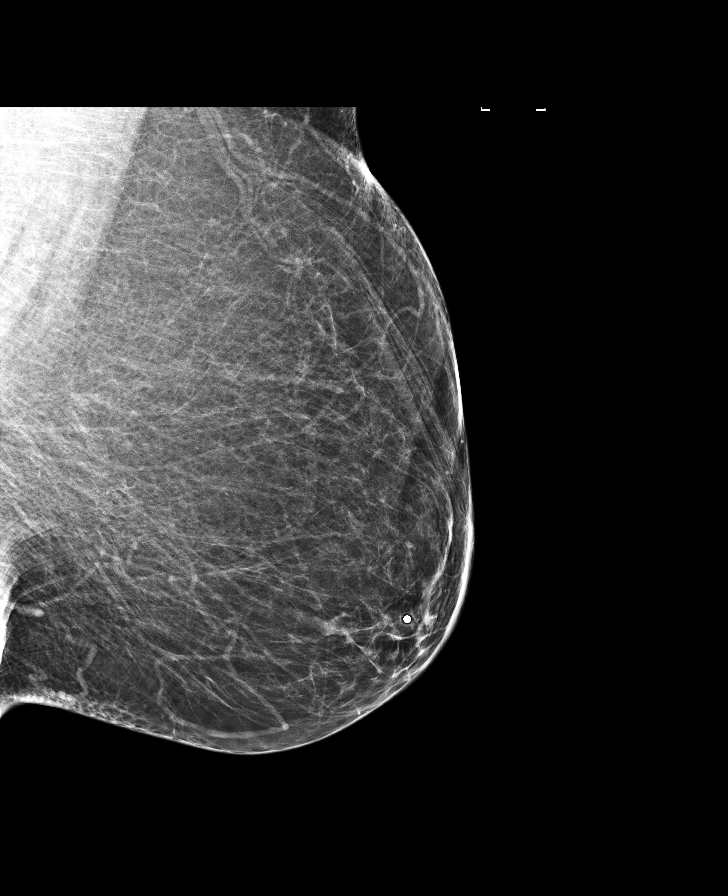

[L MLO (2 of 2)]
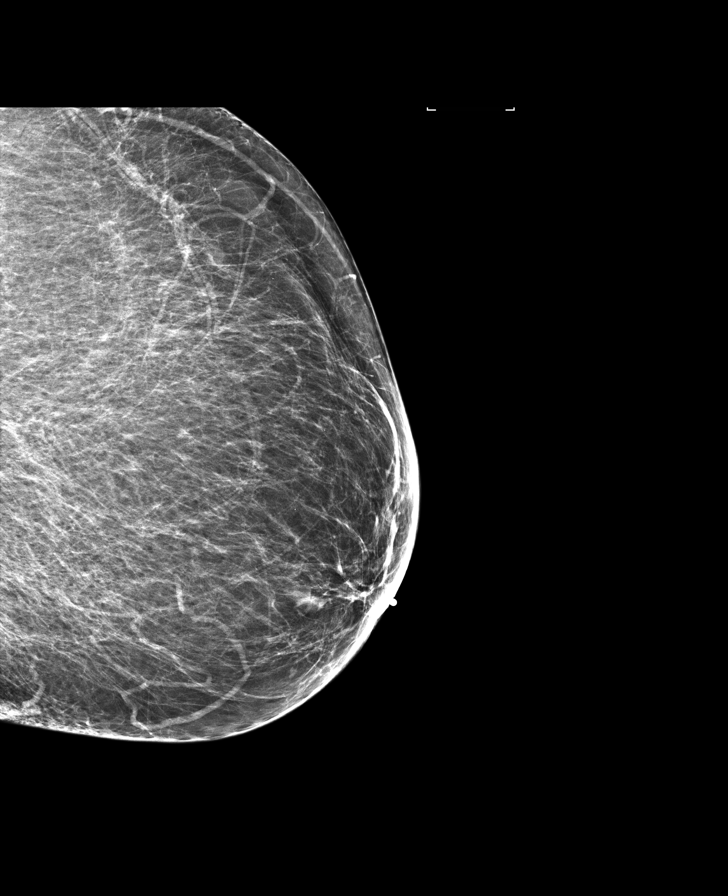

[R CC (1 of 2)]
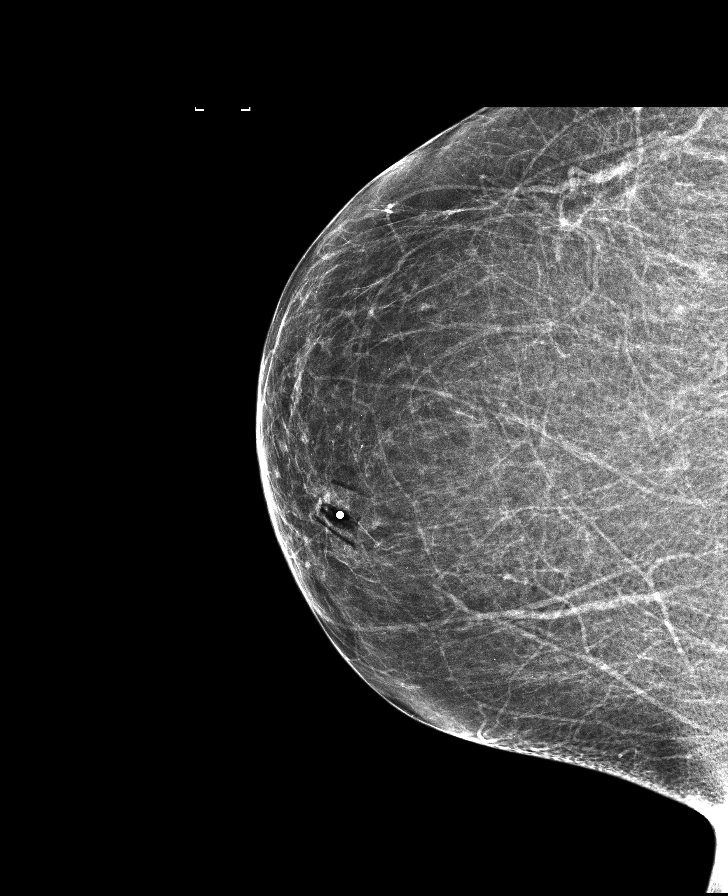

[R CC (2 of 2)]
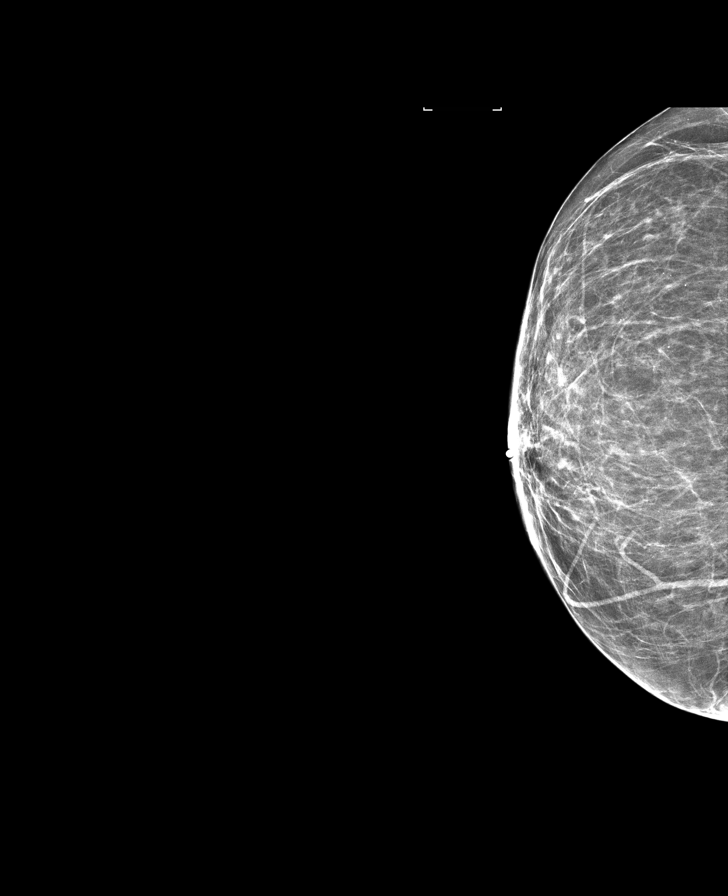

[8 of 8 positions shown; findings below may reference images not displayed]

FINDINGS: BREAST DENSITY TYPE A: The breasts are almost entirely fatty.
When compared to the prior exam, there has been overall decreased breast volume consistent with patient's interval weight loss. The skin, nipples, and both axillae are unremarkable. There is no evidence of a dominant mass, suspicious cluster of calcifications, or architectural distortion in either breast.
IMPRESSION: No mammographic evidence of malignancy.
BI-RADS Category 2: Benign
Recommendation(s): Bilateral screening mammogram in one year.
The patient information will be entered into a reminder system with a target due date for the next mammogram.
Is the patient pregnant?
No
